# Patient Record
Sex: Male | Born: 2000
Health system: Southern US, Community
[De-identification: ages and names within clinical notes are randomized; demographics above are authoritative.]

## PROBLEM LIST (undated history)

## (undated) ENCOUNTER — Emergency Department (HOSPITAL_COMMUNITY): Admission: EM | Payer: No Typology Code available for payment source | Source: Home / Self Care

## (undated) DIAGNOSIS — E271 Primary adrenocortical insufficiency: Secondary | ICD-10-CM

## (undated) HISTORY — PX: NASAL SEPTUM SURGERY: SHX37

## (undated) HISTORY — DX: Primary adrenocortical insufficiency: E27.1

## (undated) HISTORY — PX: LYMPH NODE BIOPSY: SHX201

---
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
## 2019-02-27 ENCOUNTER — Ambulatory Visit (INDEPENDENT_AMBULATORY_CARE_PROVIDER_SITE_OTHER): Payer: 59 | Admitting: Pediatrics

## 2019-04-09 ENCOUNTER — Other Ambulatory Visit: Payer: Self-pay

## 2019-04-09 DIAGNOSIS — Z20822 Contact with and (suspected) exposure to covid-19: Secondary | ICD-10-CM

## 2019-04-11 LAB — NOVEL CORONAVIRUS, NAA: SARS-CoV-2, NAA: NOT DETECTED

## 2019-04-16 ENCOUNTER — Telehealth: Payer: Self-pay

## 2019-04-16 NOTE — Telephone Encounter (Signed)
Negative COVID results given. Patient results "NOT Detected." Caller expressed understanding. ° °

## 2019-04-23 ENCOUNTER — Ambulatory Visit (INDEPENDENT_AMBULATORY_CARE_PROVIDER_SITE_OTHER): Payer: 59 | Admitting: Family Medicine

## 2019-04-23 DIAGNOSIS — T7840XA Allergy, unspecified, initial encounter: Secondary | ICD-10-CM | POA: Diagnosis not present

## 2019-04-23 MED ORDER — PREDNISONE 10 MG (21) PO TBPK: ORAL_TABLET | ORAL | 0 refills | Status: DC

## 2019-04-23 MED ORDER — HYDROXYZINE HCL 25 MG PO TABS: 25.0000 mg | ORAL_TABLET | Freq: Three times a day (TID) | ORAL | 0 refills | Status: AC | PRN

## 2019-04-23 MED ORDER — EPINEPHRINE 0.3 MG/0.3ML IJ SOAJ: 0.3000 mg | INTRAMUSCULAR | 0 refills | Status: AC | PRN

## 2019-04-23 MED ORDER — LORATADINE 10 MG PO TABS: 10.0000 mg | ORAL_TABLET | Freq: Every day | ORAL | 11 refills | Status: AC

## 2019-04-23 NOTE — Patient Instructions (Signed)
Anaphylactic Reaction, Adult An anaphylactic reaction (anaphylaxis) is a sudden, serious allergic reaction. This affects more than one part of your body. It can be life-threatening. If you have an anaphylactic reaction, you need to get medical help right away. What are the causes? This condition is caused by exposure to things that give you an allergic reaction (allergens). Common allergens include:  Foods, such as peanuts, wheat, shellfish, milk, and eggs.  Medicines.  Insect bites or stings.  Blood or parts of blood received for treatment (transfusions).  Chemicals, such as latex and dyes that are used in food and in medical tests. What are the signs or symptoms? Signs of an anaphylactic reaction may include:  Feeling warm in the face (flushed). Your face may turn red.  Itchy, red, swollen areas of skin (hives).  Swelling of the: ? Eyes. ? Lips. ? Face. ? Mouth. ? Tongue. ? Throat.  Trouble with any of these: ? Breathing. ? Talking. ? Swallowing.  Loud breathing (wheezing).  Feeling dizzy or light-headed.  Passing out (fainting).  Pain or cramps in your belly.  Throwing up (vomiting).  Watery poop (diarrhea). How is this diagnosed? This condition is diagnosed based on:  Your symptoms.  A physical exam.  Blood tests.  Recent exposure to things that give you an allergic reaction. How is this treated? If you think you are having an anaphylactic reaction, you should do this right away:  Give yourself a shot of medicine (epinephrine) using an auto-injector "pen." Your doctor will teach you how to use this pen.  Call for emergency help. If you use a pen, you must still get treated in the hospital. There, you may be given: ? Medicines. ? Oxygen. ? Fluids in an IV tube. Follow these instructions at home: Safety  Always keep an auto-injector pen with you. This could save your life. Use it as told by your doctor.  Do not drive after a reaction. Wait until  your doctor says it is safe to drive.  Make sure that you, the people who live with you, and your employer know: ? What you are allergic to, so you can stay away from it. ? How to use your auto-injector pen.  Wear a bracelet or necklace that says you have an allergy, if your doctor tells you to do this.  Learn the signs of a very bad allergic reaction. This way, you can treat it right away.  Work with your doctors to make a plan for what to do if you have a very bad reaction. It is important to be ready. If you use your auto-injector pen:   Get more medicine (epinephrine) for your pen right away. This is important in case you have another reaction.  Get help right away. To avoid a serious allergic reaction:  Avoid things that gave you a very bad allergic reaction before.  Tell your server about your allergy when you go out to eat. If you are not sure if your meal has food that you are allergic to, ask your server before you eat it. General instructions  Take over-the-counter and prescription medicines only as told by your doctor.  If you have itchy, red, swollen areas of skin or a rash: ? Use an over-the-counter medicine (antihistamine) as told by your doctor. ? Put cold, wet cloths on your skin. ? Take a cool bath or shower. Avoid hot water.  Tell all doctors who care for you that you have an allergy.  Keep all follow-up visits  as told by your doctor. This is important. Get help right away if:  You have signs of an allergic reaction. You may notice them soon after being exposed to things that give you an allergic reaction. Signs may include: ? Warmth in your face. Your face may turn red. ? Itchy, red, swollen areas of skin. ? Swelling of your:  Eyes.  Lips.  Face.  Mouth.  Tongue.  Throat. ? Trouble with any of these:  Breathing.  Talking.  Swallowing. ? Loud breathing (wheezing). ? Feeling dizzy or light-headed. ? Passing out. ? Pain or cramps in your  belly. ? Throwing up. ? Watery poop.  You had to use your auto-injector pen. You must go to the emergency room even if the medicine seems to be working. This is because another allergic reaction may happen within 3 days (rebound anaphylaxis). These symptoms may be an emergency. Do not wait to see if the symptoms will go away. Do this right away:  Use your auto-injector pen as you have been told.  Get medical help. Call your local emergency services (911 in the [place] Do not drive yourself to the hospital. Summary  An anaphylactic reaction (anaphylaxis) is a sudden, serious allergic reaction.  This condition can be life-threatening. If you have a reaction, get medical help right away.  Your doctor will show you how to give yourself a shot (epinephrine injection) with an auto-injector "pen."  Always keep an auto-injector pen with you. It could save your life. Use it as told by your doctor.  If you had to use your auto-injector pen, you must go to the emergency room. Go there even if the medicine seems to be working. This information is not intended to replace advice given to you by your health care provider. Make sure you discuss any questions you have with your health care provider. Document Released: 10/18/2007 Document Revised: 08/23/2017 Document Reviewed: 08/23/2017 Elsevier Patient Education  2020 [employer]  

## 2019-04-23 NOTE — Progress Notes (Signed)
Telephone visit  Subjective: CC: strep throat PCP: Terald Sleeper, PA-C Gary Burns is a 18 y.o. male calls for telephone consult today. Patient provides verbal consent for consult held via phone.  Location of patient: home Location of provider: [facility] Others present for call: none  1. Sore throat Patient reports sore throat, facial puffiness that onset yesterday at work.  He denies rash, fever, nausea.  He took a benadryl yesterday thinking it was an allergic reaction.  He notes that his eyes were almost swollen shut this am.  He was having some difficulty taking a good breath and yesterday but is not having problems today.  His throat still feels irritated and inflamed.   ROS: Per HPI  Allergies  Allergen Reactions  . Oatmeal Anaphylaxis  . Codeine Nausea Only and Other (See Comments)    Pt states that he cannot move from waist down and his speech is slowed.   Past Medical History:  Diagnosis Date  . Adrenal insufficiency, primary (HCC)    High dose ACTH stim test shows baseline cortisol 2.8, ACTH 1917, stimulated cortisol peak of 2.8    Current Outpatient Medications:  .  acetaminophen (TYLENOL) 325 MG tablet, Take 650 mg by mouth every 6 (six) hours as needed., Disp: , Rfl:  .  fludrocortisone (FLORINEF) 0.1 MG tablet, Take 1 tablet (0.1 mg total) by mouth daily., Disp: 30 tablet, Rfl: 6 .  hydrocortisone (CORTEF) 5 MG tablet, TAKE 1 TABLET BY MOUTH 3 TIMES DAILY. (TAKE TRIPLE DOSING FOR 5 DAYS IN CASE OF FEVER/ILLNESS)., Disp: 135 tablet, Rfl: 6 .  hydrocortisone sodium succinate (SOLU-CORTEF) 100 MG SOLR injection, Inject 100mg  IM if unable to take hydrocortisone by mouth due to vomiting or unconscious.  Then go to emergency room immediately., Disp: 1 each, Rfl: 2  Assessment/ Plan: 18 y.o. male   1. Allergic reaction, initial encounter I have placed a referral to allergy.  I have ordered prednisone Dosepak, Claritin and hydroxyzine for the patient as well as an  EpiPen.  We discussed the EpiPen is not to be used now but to be used in the future if he has severe allergic reaction.  We discussed that he should seek immediate medical attention emergency department should he ever find the need to use the EpiPen.  We discussed red flag signs and symptoms warranting evaluation in the ED as well.  I discussed prednisone with his pediatric endocrinologist who gave me the go ahead to proceed with use.  He will continue his other prescribed medications for adrenal insufficiency. - Ambulatory referral to Allergy - predniSONE (STERAPRED UNI-PAK 21 TAB) 10 MG (21) TBPK tablet; As directed x 6 days  Dispense: 21 tablet; Refill: 0 - loratadine (CLARITIN) 10 MG tablet; Take 1 tablet (10 mg total) by mouth daily.  Dispense: 30 tablet; Refill: 11 - hydrOXYzine (ATARAX/VISTARIL) 25 MG tablet; Take 1 tablet (25 mg total) by mouth 3 (three) times daily as needed for itching.  Dispense: 30 tablet; Refill: 0 - EPINEPHrine (EPIPEN 2-PAK) 0.3 mg/0.3 mL IJ SOAJ injection; Inject 0.3 mLs (0.3 mg total) into the muscle as needed for anaphylaxis. (PhD please go over with patient how to use in case of emergency)  Dispense: 1 each; Refill: 0   Start time: 1:17pm; called back at 1:29pm End time: 1:21pm; ended at 1:31pm  Total time spent on patient care (including telephone call/ virtual visit): 22 minutes  [place], [place] [phone]

## 2019-05-06 ENCOUNTER — Ambulatory Visit: Payer: 59 | Admitting: Allergy and Immunology

## 2019-06-25 ENCOUNTER — Ambulatory Visit: Payer: 59 | Attending: Internal Medicine

## 2019-06-25 ENCOUNTER — Other Ambulatory Visit: Payer: Self-pay

## 2019-06-25 DIAGNOSIS — Z20822 Contact with and (suspected) exposure to covid-19: Secondary | ICD-10-CM | POA: Insufficient documentation

## 2019-06-26 LAB — NOVEL CORONAVIRUS, NAA: SARS-CoV-2, NAA: NOT DETECTED

## 2019-12-11 ENCOUNTER — Other Ambulatory Visit: Payer: Self-pay

## 2019-12-11 ENCOUNTER — Encounter (HOSPITAL_COMMUNITY): Payer: Self-pay

## 2019-12-11 DIAGNOSIS — E271 Primary adrenocortical insufficiency: Secondary | ICD-10-CM | POA: Diagnosis not present

## 2019-12-11 DIAGNOSIS — F17210 Nicotine dependence, cigarettes, uncomplicated: Secondary | ICD-10-CM | POA: Diagnosis not present

## 2019-12-11 DIAGNOSIS — F17220 Nicotine dependence, chewing tobacco, uncomplicated: Secondary | ICD-10-CM | POA: Diagnosis not present

## 2019-12-11 DIAGNOSIS — R109 Unspecified abdominal pain: Secondary | ICD-10-CM | POA: Diagnosis not present

## 2019-12-11 DIAGNOSIS — R531 Weakness: Secondary | ICD-10-CM | POA: Diagnosis present

## 2019-12-11 NOTE — ED Triage Notes (Signed)
Pt reports history of Addison's, states pain in his abdomen worsened today.  Pt also c/o some increased pain to right flank area also.  Pt c/o nausea, no vomiting or diarrhea.

## 2019-12-12 ENCOUNTER — Emergency Department (HOSPITAL_COMMUNITY)
Admission: EM | Admit: 2019-12-12 | Discharge: 2019-12-12 | Disposition: A | Discharge status: Home / Self Care | Payer: 59 | Attending: Emergency Medicine | Admitting: Emergency Medicine

## 2019-12-12 DIAGNOSIS — E271 Primary adrenocortical insufficiency: Secondary | ICD-10-CM | POA: Diagnosis not present

## 2019-12-12 DIAGNOSIS — F17210 Nicotine dependence, cigarettes, uncomplicated: Secondary | ICD-10-CM | POA: Diagnosis not present

## 2019-12-12 DIAGNOSIS — F17220 Nicotine dependence, chewing tobacco, uncomplicated: Secondary | ICD-10-CM | POA: Diagnosis not present

## 2019-12-12 DIAGNOSIS — R109 Unspecified abdominal pain: Secondary | ICD-10-CM | POA: Diagnosis not present

## 2019-12-12 HISTORY — DX: Primary adrenocortical insufficiency: E27.1

## 2019-12-12 LAB — COMPREHENSIVE METABOLIC PANEL
ALT: 15 U/L (ref 0–44)
AST: 21 U/L (ref 15–41)
Albumin: 5.2 g/dL — ABNORMAL HIGH (ref 3.5–5.0)
Alkaline Phosphatase: 98 U/L (ref 38–126)
Anion gap: 7 (ref 5–15)
BUN: 19 mg/dL (ref 6–20)
CO2: 25 mmol/L (ref 22–32)
Calcium: 9.4 mg/dL (ref 8.9–10.3)
Chloride: 103 mmol/L (ref 98–111)
Creatinine, Ser: 1.05 mg/dL (ref 0.61–1.24)
GFR calc Af Amer: >60 mL/min (ref >60)
GFR calc non Af Amer: >60 mL/min (ref >60)
Glucose, Bld: 63 mg/dL — ABNORMAL LOW (ref 70–99)
Potassium: 3.9 mmol/L (ref 3.5–5.1)
Sodium: 135 mmol/L (ref 135–145)
Total Bilirubin: 1.2 mg/dL (ref 0.3–1.2)
Total Protein: 8.3 g/dL — ABNORMAL HIGH (ref 6.5–8.1)

## 2019-12-12 LAB — CBC WITH DIFFERENTIAL/PLATELET
Abs Immature Granulocytes: 0.01 10*3/uL (ref 0.00–0.07)
Basophils Absolute: 0.1 10*3/uL (ref 0.0–0.1)
Basophils Relative: 1 %
Eosinophils Absolute: 0.1 10*3/uL (ref 0.0–0.5)
Eosinophils Relative: 2 %
HCT: 43.7 % (ref 39.0–52.0)
Hemoglobin: 14.7 g/dL (ref 13.0–17.0)
Immature Granulocytes: 0 %
Lymphocytes Relative: 30 %
Lymphs Abs: 2 10*3/uL (ref 0.7–4.0)
MCH: 30.5 pg (ref 26.0–34.0)
MCHC: 33.6 g/dL (ref 30.0–36.0)
MCV: 90.7 fL (ref 80.0–100.0)
Monocytes Absolute: 0.7 10*3/uL (ref 0.1–1.0)
Monocytes Relative: 10 %
Neutro Abs: 3.9 10*3/uL (ref 1.7–7.7)
Neutrophils Relative %: 57 %
Platelets: 175 10*3/uL (ref 150–400)
RBC: 4.82 MIL/uL (ref 4.22–5.81)
RDW: 12.7 % (ref 11.5–15.5)
WBC: 6.7 10*3/uL (ref 4.0–10.5)
nRBC: 0 % (ref 0.0–0.2)

## 2019-12-12 LAB — CBG MONITORING, ED
Glucose-Capillary: 92 mg/dL (ref 70–99)
Glucose-Capillary: 91 mg/dL (ref 70–99)

## 2019-12-12 NOTE — Discharge Instructions (Addendum)
Increase your hydrocortisone to 7.5 mg (1+1/2 tablets) three times a day.  You will need to work with your endocrinologist for any future dose adjustments,
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
## 2020-01-29 ENCOUNTER — Ambulatory Visit (INDEPENDENT_AMBULATORY_CARE_PROVIDER_SITE_OTHER): Payer: 59 | Admitting: Pediatrics
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
## 2020-02-03 ENCOUNTER — Ambulatory Visit (INDEPENDENT_AMBULATORY_CARE_PROVIDER_SITE_OTHER): Payer: 59 | Admitting: Nurse Practitioner

## 2020-02-03 DIAGNOSIS — Z20822 Contact with and (suspected) exposure to covid-19: Secondary | ICD-10-CM

## 2020-02-03 NOTE — Progress Notes (Signed)
   Virtual Visit via telephone Note Due to COVID-19 pandemic this visit was conducted virtually. This visit type was conducted due to national recommendations for restrictions regarding the COVID-19 Pandemic (e.g. social distancing, sheltering in place) in an effort to limit this patient's exposure and mitigate transmission in our community. All issues noted in this document were discussed and addressed.  A physical exam was not performed with this format.  I connected with Gary Burns on 02/03/20 at 1:00 by telephone and verified:: that I am speaking with the correct person using two identifiers. Gary Burns is currently located at home and no one is currently with  him during visit. The provider, Mary-Margaret Daphine Deutscher, FNP is located in their office at time of visit.  I discussed the limitations, risks, security and privacy concerns of performing an evaluation and management service by telephone and the availability of in person appointments. I also discussed with the patient that there may be a patient responsible charge related to this service. The patient expressed understanding and agreed to proceed.   History and Present Illness:   Chief Complaint: Covid Exposure   HPI Was exposed to positive coved person last Sunday. He denies any symptoms since exposure. He has not had vaccines. He has been in quarantine since exposure.   Review of Systems  Constitutional: Negative.   HENT: Negative.   Respiratory: Negative.   Cardiovascular: Negative.   Genitourinary: Negative.   Skin: Negative.   Neurological: Negative.   Psychiatric/Behavioral: Negative.   All other systems reviewed and are negative.    Observations/Objective: Alert and oriented- answers all questions appropriately No distress    Assessment and Plan: Gary Burns in today with chief complaint of Covid Exposure   1. Close exposure to COVID-19 virus Patient educated about covid - Novel Coronavirus, NAA  ([facility]); Future    Follow Up Instructions: prn    I discussed the assessment and treatment plan with the patient. The patient was provided an opportunity to ask questions and all were answered. The patient agreed with the plan and demonstrated an understanding of the instructions.   The patient was advised to call back or seek an in-person evaluation if the symptoms worsen or if the condition fails to improve as anticipated.  The above assessment and management plan was discussed with the patient. The patient verbalized understanding of and has agreed to the management plan. Patient is aware to call the clinic if symptoms persist or worsen. Patient is aware when to return to the clinic for a follow-up visit. Patient educated on when it is appropriate to go to the emergency department.   Time call ended:  1:17  I provided 17 minutes of non-face-to-face time during this encounter.    Mary-Margaret Daphine Deutscher, FNP

## 2020-02-03 NOTE — Addendum Note (Signed)
Addended by: Prescott Gum on: 02/03/2020 02:14 PM   Modules accepted: Orders

## 2020-02-05 LAB — NOVEL CORONAVIRUS, NAA: SARS-CoV-2, NAA: NOT DETECTED

## 2020-02-05 LAB — SARS-COV-2, NAA 2 DAY TAT

## 2020-03-02 ENCOUNTER — Other Ambulatory Visit: Payer: Self-pay

## 2020-03-02 ENCOUNTER — Other Ambulatory Visit: Payer: PRIVATE HEALTH INSURANCE

## 2020-03-02 DIAGNOSIS — Z20822 Contact with and (suspected) exposure to covid-19: Secondary | ICD-10-CM

## 2020-03-03 LAB — NOVEL CORONAVIRUS, NAA: SARS-CoV-2, NAA: NOT DETECTED

## 2020-11-08 ENCOUNTER — Ambulatory Visit: Payer: 59 | Admitting: Family Medicine

## 2021-07-13 DEATH — deceased
